# Patient Record
Sex: Male | Born: 1955 | Race: White | Hispanic: No | Marital: Single | Smoking: Current every day smoker
Health system: Southern US, Community
[De-identification: ages and names within clinical notes are randomized; demographics above are authoritative.]

---

## 2010-01-30 ENCOUNTER — Ambulatory Visit: Payer: Self-pay | Admitting: Gastroenterology

## 2010-03-14 ENCOUNTER — Ambulatory Visit (HOSPITAL_COMMUNITY): Admission: RE | Admit: 2010-03-14 | Discharge: 2010-03-14 | Payer: Self-pay | Admitting: Gastroenterology

## 2010-07-22 LAB — CBC
HCT: 45.3 % (ref 39.0–52.0)
Hemoglobin: 16 g/dL (ref 13.0–17.0)
MCH: 31.9 pg (ref 26.0–34.0)
MCHC: 35.3 g/dL (ref 30.0–36.0)
MCV: 90.4 fL (ref 78.0–100.0)
RDW: 12.7 % (ref 11.5–15.5)

## 2010-07-22 LAB — PROTIME-INR: INR: 0.94 (ref 0.00–1.49)

## 2011-11-19 IMAGING — US US BIOPSY
1 series · 9 of 9 positions shown · non-contrast
Comparison: none

Clinical: Hepatitis C.

ULTRASOUND-GUIDED RANDOM CORE LIVER BIOPSY:
An ultrasound guided liver biopsy was thoroughly discussed with the
patient and questions were answered. The benefits, risks,
alternatives, and complications were also discussed. The patient
understands and wishes to proceed with the procedure. A verbal as
well as written consent was obtained.
Ultrasound imaging of the liver was performed and an appropriate
skin entry site was determined. Skin site was marked, prepped and
draped in the usual sterile fashion. 1% Lidocaine was infiltrated
locally. A 17 gauge Trocar needle was advanced under ultrasound
guidance into the liver. Imaging was obtained documenting
appropriate needle position.  Three coaxial 18 gauge core samples
were then obtained and sent to the laboratory for further analysis.
Post procedure scans demonstrate no evidence of bleeding or
hematoma.  The patient tolerated the procedure well with no
immediate complication.
Cardiac and respiratory monitoring was provided by the radiology
RN.  Moderate sedation in the form of 50 mcg fentanyl and 3 mg of
versed were administered throughout the procedure for a total of 15
minutes.

[Series 1: us biopsy · 0.21mm/px · 9 of 9 slices shown]
[im 1/9]
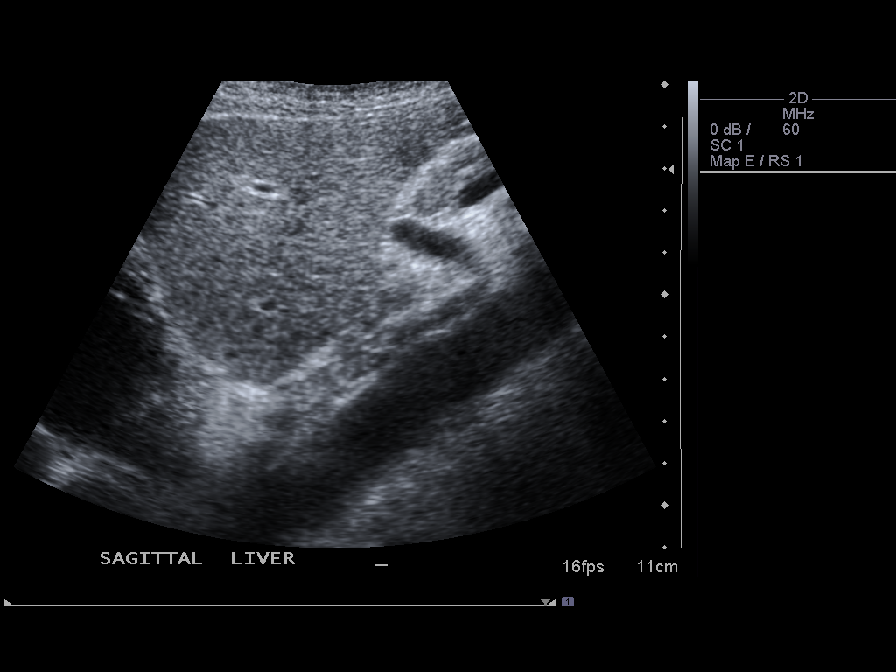
[im 2/9]
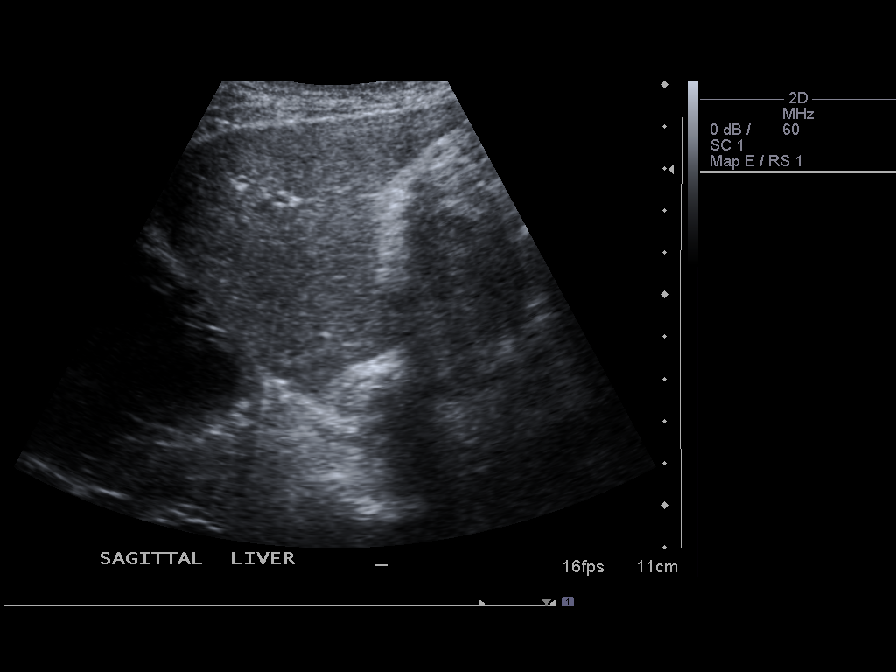
[im 3/9]
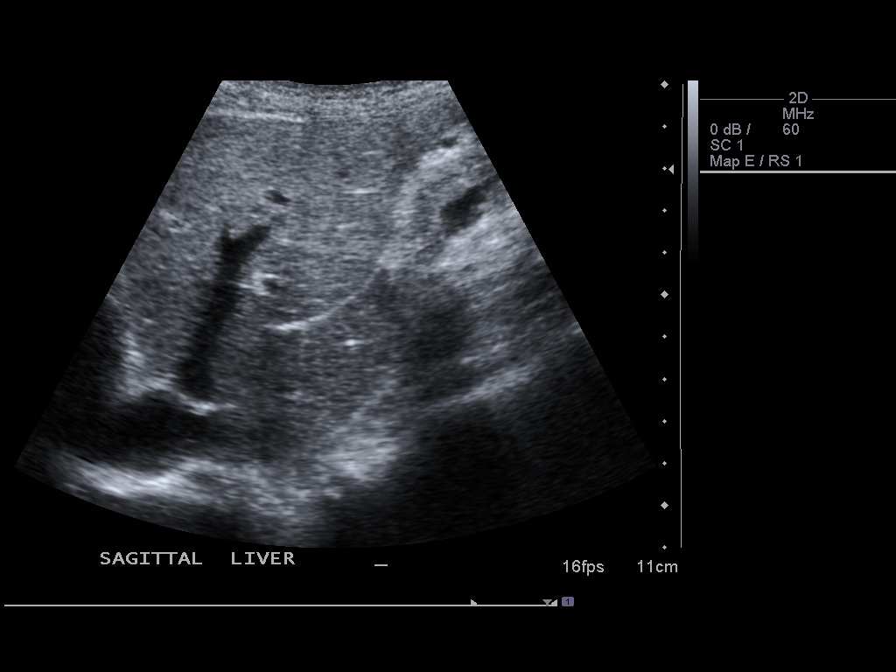
[im 4/9]
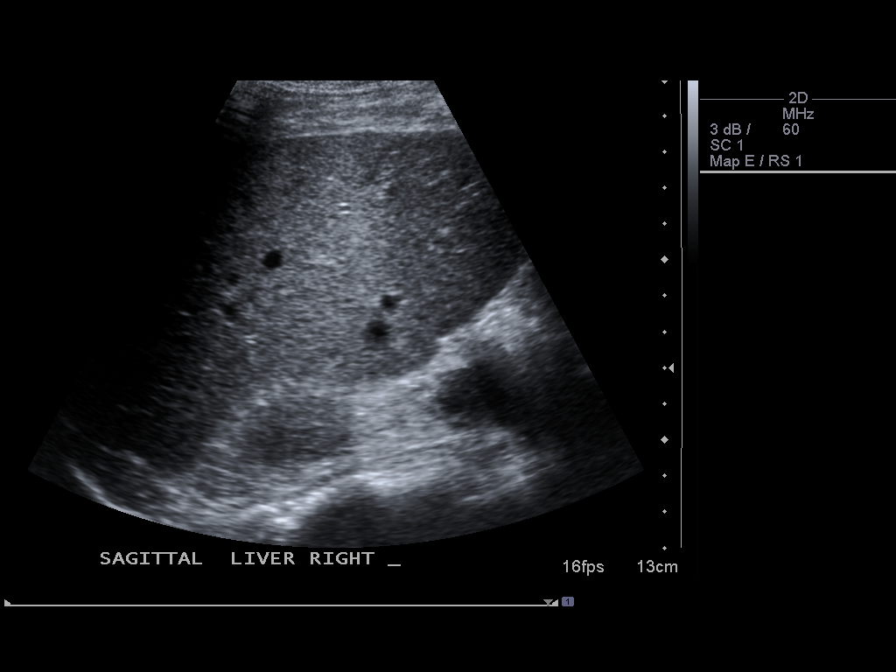
[im 5/9]
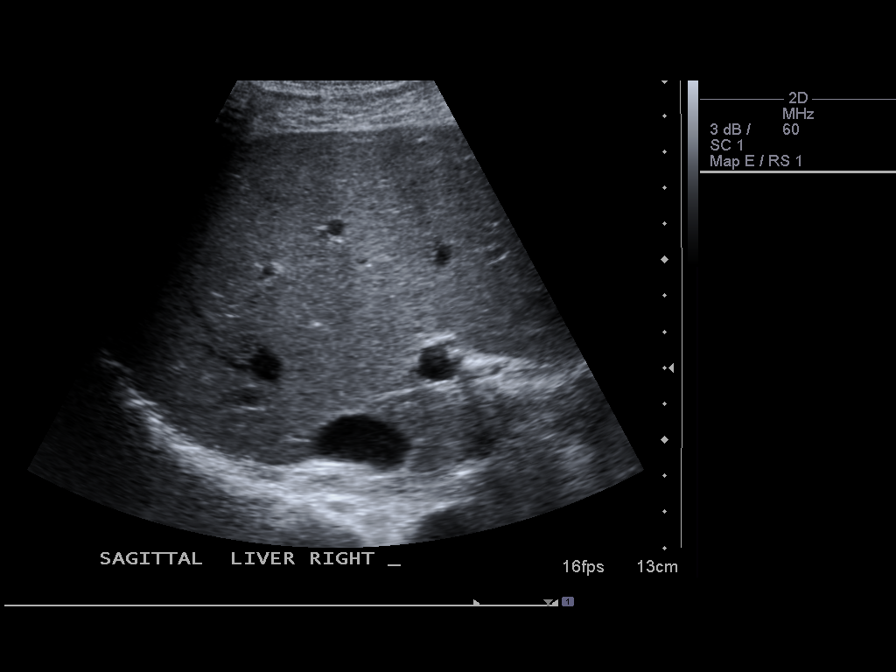
[im 6/9]
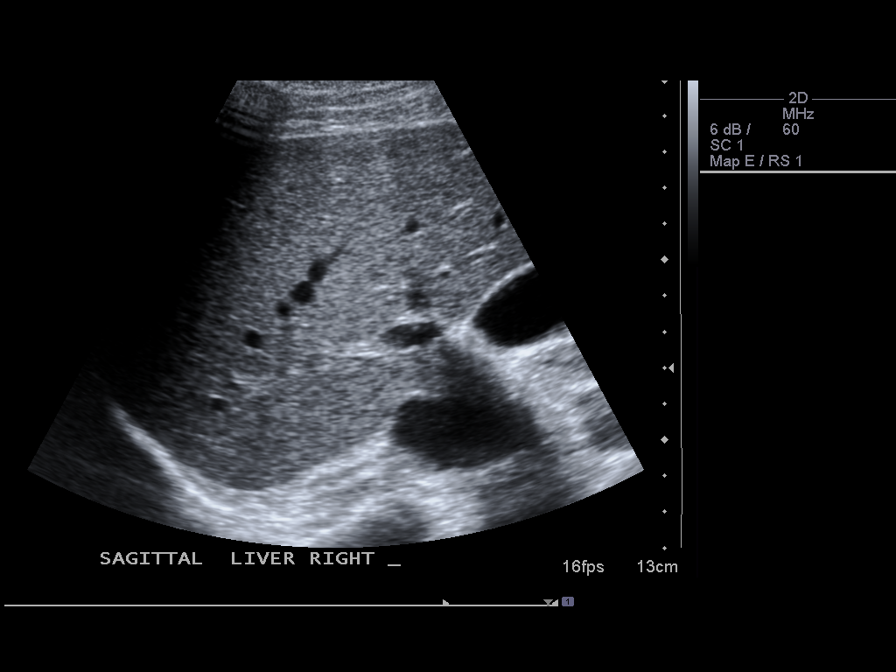
[im 7/9]
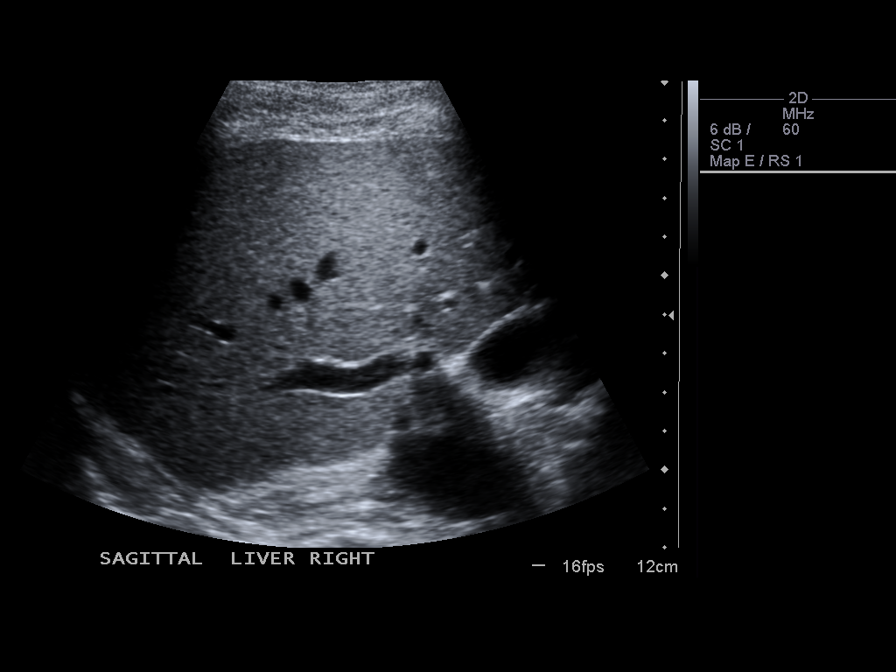
[im 8/9]
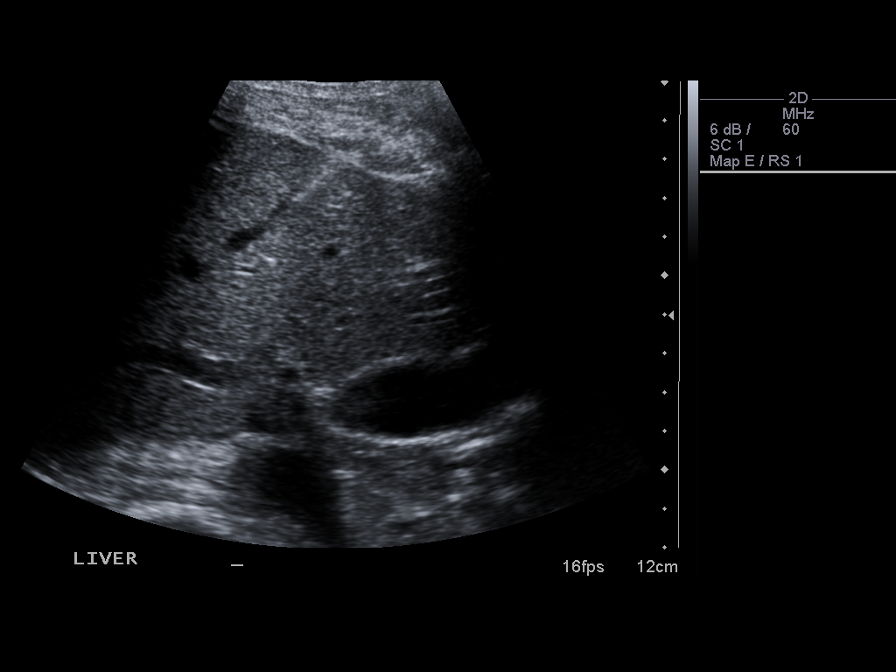
[im 9/9]
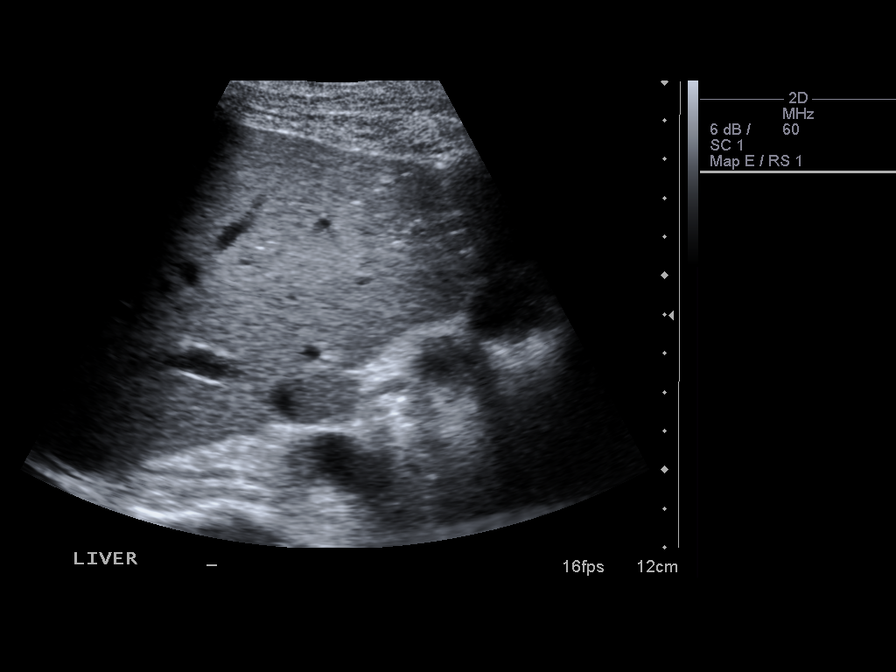

[9 of 9 positions shown; findings below may reference images not displayed]

IMPRESSION: 1. Technically successful ultrasound guided random liver core
biopsy with moderate sedation as described above.

Read by: Oxendine, Sorin.-ZEINAB

## 2015-08-29 ENCOUNTER — Emergency Department (HOSPITAL_BASED_OUTPATIENT_CLINIC_OR_DEPARTMENT_OTHER)
Admission: EM | Admit: 2015-08-29 | Discharge: 2015-08-29 | Disposition: A | Discharge status: Home / Self Care | Payer: Self-pay | Attending: Emergency Medicine | Admitting: Emergency Medicine

## 2015-08-29 ENCOUNTER — Encounter (HOSPITAL_BASED_OUTPATIENT_CLINIC_OR_DEPARTMENT_OTHER): Payer: Self-pay | Admitting: *Deleted

## 2015-08-29 ENCOUNTER — Emergency Department (HOSPITAL_BASED_OUTPATIENT_CLINIC_OR_DEPARTMENT_OTHER): Payer: Self-pay

## 2015-08-29 DIAGNOSIS — Y9261 Building [any] under construction as the place of occurrence of the external cause: Secondary | ICD-10-CM | POA: Insufficient documentation

## 2015-08-29 DIAGNOSIS — Y288XXA Contact with other sharp object, undetermined intent, initial encounter: Secondary | ICD-10-CM | POA: Insufficient documentation

## 2015-08-29 DIAGNOSIS — S61432A Puncture wound without foreign body of left hand, initial encounter: Secondary | ICD-10-CM | POA: Insufficient documentation

## 2015-08-29 DIAGNOSIS — F17210 Nicotine dependence, cigarettes, uncomplicated: Secondary | ICD-10-CM | POA: Insufficient documentation

## 2015-08-29 DIAGNOSIS — S61412A Laceration without foreign body of left hand, initial encounter: Secondary | ICD-10-CM | POA: Insufficient documentation

## 2015-08-29 DIAGNOSIS — Y990 Civilian activity done for income or pay: Secondary | ICD-10-CM | POA: Insufficient documentation

## 2015-08-29 DIAGNOSIS — Y93H3 Activity, building and construction: Secondary | ICD-10-CM | POA: Insufficient documentation

## 2015-08-29 DIAGNOSIS — S6992XA Unspecified injury of left wrist, hand and finger(s), initial encounter: Secondary | ICD-10-CM

## 2015-08-29 MED ORDER — HYDROCODONE-ACETAMINOPHEN 5-325 MG PO TABS: 2.0000 | ORAL_TABLET | ORAL | Status: AC | PRN

## 2015-08-29 MED ORDER — FENTANYL CITRATE (PF) 100 MCG/2ML IJ SOLN
50.0000 ug | Freq: Once | INTRAMUSCULAR | Status: AC
  Administered 2015-08-29: 50 ug via INTRAVENOUS
  Filled 2015-08-29: qty 2

## 2015-08-29 MED ORDER — TETANUS-DIPHTH-ACELL PERTUSSIS 5-2.5-18.5 LF-MCG/0.5 IM SUSP
0.5000 mL | Freq: Once | INTRAMUSCULAR | Status: AC
  Administered 2015-08-29: 0.5 mL via INTRAMUSCULAR
  Filled 2015-08-29: qty 0.5

## 2015-08-29 MED ORDER — ONDANSETRON HCL 4 MG/2ML IJ SOLN
4.0000 mg | Freq: Once | INTRAMUSCULAR | Status: AC
  Administered 2015-08-29: 4 mg via INTRAVENOUS
  Filled 2015-08-29: qty 2

## 2015-08-29 MED ORDER — LIDOCAINE HCL (PF) 1 % IJ SOLN
30.0000 mL | Freq: Once | INTRAMUSCULAR | Status: AC
  Administered 2015-08-29: 30 mL
  Filled 2015-08-29: qty 30

## 2015-08-29 MED ORDER — CEPHALEXIN 500 MG PO CAPS: 500.0000 mg | ORAL_CAPSULE | Freq: Three times a day (TID) | ORAL | Status: AC

## 2015-08-29 NOTE — ED Notes (Signed)
Laceration to his left hand with a wood working tool. He is pale and diaphoretic.

## 2015-08-29 NOTE — ED Notes (Signed)
Suture cart at bedside 

## 2015-08-29 NOTE — ED Notes (Signed)
Pa at the bedside suturing

## 2015-08-29 NOTE — Discharge Instructions (Signed)
Laceration Care, Adult °A laceration is a cut that goes through all of the layers of the skin and into the tissue that is right under the skin. Some lacerations heal on their own. Others need to be closed with stitches (sutures), staples, skin adhesive strips, or skin glue. Proper laceration care minimizes the risk of infection and helps the laceration to heal better. °HOW TO CARE FOR YOUR LACERATION °If sutures or staples were used: °· Keep the wound clean and dry. °· If you were given a bandage (dressing), you should change it at least one time per day or as told by your health care provider. You should also change it if it becomes wet or dirty. °· Keep the wound completely dry for the first 24 hours or as told by your health care provider. After that time, you may shower or bathe. However, make sure that the wound is not soaked in water until after the sutures or staples have been removed. °· Clean the wound one time each day or as told by your health care provider: °¨ Wash the wound with soap and water. °¨ Rinse the wound with water to remove all soap. °¨ Pat the wound dry with a clean towel. Do not rub the wound. °· After cleaning the wound, apply a thin layer of antibiotic ointment as told by your health care provider. This will help to prevent infection and keep the dressing from sticking to the wound. °· Have the sutures or staples removed as told by your health care provider. °If skin adhesive strips were used: °· Keep the wound clean and dry. °· If you were given a bandage (dressing), you should change it at least one time per day or as told by your health care provider. You should also change it if it becomes dirty or wet. °· Do not get the skin adhesive strips wet. You may shower or bathe, but be careful to keep the wound dry. °· If the wound gets wet, pat it dry with a clean towel. Do not rub the wound. °· Skin adhesive strips fall off on their own. You may trim the strips as the wound heals. Do not  remove skin adhesive strips that are still stuck to the wound. They will fall off in time. °If skin glue was used: °· Try to keep the wound dry, but you may briefly wet it in the shower or bath. Do not soak the wound in water, such as by swimming. °· After you have showered or bathed, gently pat the wound dry with a clean towel. Do not rub the wound. °· Do not do any activities that will make you sweat heavily until the skin glue has fallen off on its own. °· Do not apply liquid, cream, or ointment medicine to the wound while the skin glue is in place. Using those may loosen the film before the wound has healed. °· If you were given a bandage (dressing), you should change it at least one time per day or as told by your health care provider. You should also change it if it becomes dirty or wet. °· If a dressing is placed over the wound, be careful not to apply tape directly over the skin glue. Doing that may cause the glue to be pulled off before the wound has healed. °· Do not pick at the glue. The skin glue usually remains in place for 5-10 days, then it falls off of the skin. °General Instructions °· Take over-the-counter and prescription   medicines only as told by your health care provider. °· If you were prescribed an antibiotic medicine or ointment, take or apply it as told by your doctor. Do not stop using it even if your condition improves. °· To help prevent scarring, make sure to cover your wound with sunscreen whenever you are outside after stitches are removed, after adhesive strips are removed, or when glue remains in place and the wound is healed. Make sure to wear a sunscreen of at least 30 SPF. °· Do not scratch or pick at the wound. °· Keep all follow-up visits as told by your health care provider. This is important. °· Check your wound every day for signs of infection. Watch for: °· Redness, swelling, or pain. °· Fluid, blood, or pus. °· Raise (elevate) the injured area above the level of your heart  while you are sitting or lying down, if possible. °SEEK MEDICAL CARE IF: °· You received a tetanus shot and you have swelling, severe pain, redness, or bleeding at the injection site. °· You have a fever. °· A wound that was closed breaks open. °· You notice a bad smell coming from your wound or your dressing. °· You notice something coming out of the wound, such as wood or glass. °· Your pain is not controlled with medicine. °· You have increased redness, swelling, or pain at the site of your wound. °· You have fluid, blood, or pus coming from your wound. °· You notice a change in the color of your skin near your wound. °· You need to change the dressing frequently due to fluid, blood, or pus draining from the wound. °· You develop a new rash. °· You develop numbness around the wound. °SEEK IMMEDIATE MEDICAL CARE IF: °· You develop severe swelling around the wound. °· Your pain suddenly increases and is severe. °· You develop painful lumps near the wound or on skin that is anywhere on your body. °· You have a red streak going away from your wound. °· The wound is on your hand or foot and you cannot properly move a finger or toe. °· The wound is on your hand or foot and you notice that your fingers or toes look pale or bluish. °  °This information is not intended to replace advice given to you by your health care provider. Make sure you discuss any questions you have with your health care provider. °  °Document Released: 04/27/2005 Document Revised: 09/11/2014 Document Reviewed: 04/23/2014 °Elsevier Interactive Patient Education ©2016 [employer] ° °Sutured Wound Care °Sutures are stitches that can be used to close wounds. Taking care of your wound properly can help to prevent pain and infection. It can also help your wound to heal more quickly. °HOW TO CARE FOR YOUR SUTURED WOUND °Wound Care °· Keep the wound clean and dry. °· If you were given a bandage (dressing), you should change it at least once per day or  as directed by your health care provider. You should also change it if it becomes wet or dirty. °· Keep the wound completely dry for the first 24 hours or as directed by your health care provider. After that time, you may shower or bathe. However, make sure that the wound is not soaked in water until the sutures have been removed. °· Clean the wound one time each day or as directed by your health care provider. °¨ Wash the wound with soap and water. °¨ Rinse the wound with water to remove all soap. °¨   Pat the wound dry with a clean towel. Do not rub the wound. °· After cleaning the wound, apply a thin layer of antibiotic ointment as directed by your health care provider. This will help to prevent infection and keep the dressing from sticking to the wound. °· Have the sutures removed as directed by your health care provider. °General Instructions °· Take or apply medicines only as directed by your health care provider. °· To help prevent scarring, make sure to cover your wound with sunscreen whenever you are outside after the sutures are removed and the wound is healed. Make sure to wear a sunscreen of at least 30 SPF. °· If you were prescribed an antibiotic medicine or ointment, finish all of it even if you start to feel better. °· Do not scratch or pick at the wound. °· Keep all follow-up visits as directed by your health care provider. This is important. °· Check your wound every day for signs of infection. Watch for:   °¨ Redness, swelling, or pain. °¨ Fluid, blood, or pus. °· Raise (elevate) the injured area above the level of your heart while you are sitting or lying down, if possible. °· Avoid stretching your wound. °· Drink enough fluids to keep your urine clear or pale yellow. °SEEK MEDICAL CARE IF: °· You received a tetanus shot and you have swelling, severe pain, redness, or bleeding at the injection site. °· You have a fever. °· A wound that was closed breaks open. °· You notice a bad smell coming from  the wound. °· You notice something coming out of the wound, such as wood or glass. °· Your pain is not controlled with medicine. °· You have increased redness, swelling, or pain at the site of your wound. °· You have fluid, blood, or pus coming from your wound. °· You notice a change in the color of your skin near your wound. °· You need to change the dressing frequently due to fluid, blood, or pus draining from the wound. °· You develop a new rash. °· You develop numbness around the wound. °SEEK IMMEDIATE MEDICAL CARE IF: °· You develop severe swelling around the injury site. °· Your pain suddenly increases and is severe. °· You develop painful lumps near the wound or on skin that is anywhere on your body. °· You have a red streak going away from your wound. °· The wound is on your hand or foot and you cannot properly move a finger or toe. °· The wound is on your hand or foot and you notice that your fingers or toes look pale or bluish. °  °This information is not intended to replace advice given to you by your health care provider. Make sure you discuss any questions you have with your health care provider. °  °Document Released: 06/04/2004 Document Revised: 09/11/2014 Document Reviewed: 12/07/2012 °Elsevier Interactive Patient Education ©2016 [employer] ° °

## 2015-08-29 NOTE — ED Provider Notes (Signed)
CSN: [id number]     Arrival date & time 08/29/15  1620 History   First MD Initiated Contact with Patient 08/29/15 1633     Chief Complaint  Patient presents with  . Extremity Laceration     (Consider location/radiation/quality/duration/timing/severity/associated sxs/prior Treatment) HPI  Is a 60 year old male who presents emergency Department with chief complaint of left hand injury. 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 Patient states that it came un-plugged in one of his coworkers turned it back on without him realizing it and it cut into his left thenar eminence. He c/o pain in that area. Bleeding is controlled. He denies numbness or tingling. He is able to move his thumb in all directions. Unsure of last tetanus.   History reviewed. No pertinent past medical history. History reviewed. No pertinent past surgical history. No family history on file. Social History  Substance Use Topics  . Smoking status: Current Every Day Smoker -- 0.50 packs/day    Types: Cigarettes  . Smokeless tobacco: None  . Alcohol Use: No    Review of Systems  Ten systems reviewed and are negative for acute change, except as noted in the HPI.    Allergies  Review of patient's allergies indicates no known allergies.  Home Medications   Prior to Admission medications   Not on File   BP 93/56 mmHg  Pulse 79  Temp(Src) 97.5 F (36.4 C) (Oral)  Resp 22  Ht 5' 4.5" (1.638 m)  Wt 65.772 kg  BMI 24.51 kg/m2  SpO2 100% Physical Exam  Constitutional: He is oriented to person, place, and time. He appears well-developed and well-nourished. No distress.  HENT:  Head: Normocephalic and atraumatic.  Eyes: Conjunctivae are normal. No scleral icterus.  Neck: Normal range of motion. Neck supple.  Cardiovascular: Normal rate, regular rhythm and normal heart sounds.   Pulmonary/Chest: Effort normal and breath sounds normal. No respiratory distress.  Abdominal:  Soft. There is no tenderness.  Musculoskeletal: He exhibits no edema.  Left hand with large puncture wound (about 4 cm) into left thenar eminence. Mangled tissue hangs at the superficial aspect 6 cm laceration extending across the palm away from the puncture sites. Full ROM including abduction, adduction, flexion, extension and opposition. Full strength and sensation. Cap refill < 3 sec.   Neurological: He is alert and oriented to person, place, and time.  Skin: Skin is warm and dry. He is not diaphoretic.  Psychiatric: His behavior is normal.  Nursing note and vitals reviewed.   ED Course  .Marland Kitchen.Laceration Repair Date/Time: 08/29/2015 7:36 PM Performed by: Arthor CaptainHARRIS, Pleshette Tomasini Authorized by: Arthor CaptainHARRIS, Lou Loewe Consent: Verbal consent obtained. Risks and benefits: risks, benefits and alternatives were discussed Consent given by: patient Patient identity confirmed: verbally with patient and provided demographic data Time out: Immediately prior to procedure a "time out" was called to verify the correct patient, procedure, equipment, support staff and site/side marked as required. Body area: upper extremity Location details: left hand Laceration length: 10 cm Foreign bodies: no foreign bodies Tendon involvement: none Nerve involvement: none Vascular damage: no Anesthesia: local infiltration Local anesthetic: lidocaine 1% without epinephrine Anesthetic total: 15 ml Patient sedated: no Irrigation solution: saline Irrigation method: jet lavage Amount of cleaning: extensive Debridement: minimal Degree of undermining: none Skin closure: 4-0 Prolene Subcutaneous closure: 4-0 Vicryl Number of sutures: 16 Technique: simple Approximation: close Approximation difficulty: complex Comments: Deep laceration involving the underlying muscle of the thenar eminence.    (including critical care  time) Labs Review Labs Reviewed - No data to display  Imaging Review No results found. I have personally  reviewed and evaluated these images and lab results as part of my medical decision-making.   EKG Interpretation   Date/Time:  Thursday August 29 2015 16:34:09 EDT Ventricular Rate:  85 PR Interval:  152 QRS Duration: 90 QT Interval:  360 QTC Calculation: 428 R Axis:   82 Text Interpretation:  Sinus rhythm Borderline right axis deviation Minimal  ST elevation, inferior leads no previous Confirmed by ZACKOWSKI  MD, SCOTT  ([id number]) on 08/29/2015 4:44:58 PM      MDM   Final diagnoses:  Puncture wound to hand, left, initial encounter  Injury of palm, left, initial encounter  Laceration of hand, left, initial encounter    Patient with complicated hand injury. Repaired here with sub q stitches to bring muscular layers together. Top layers closed with prolene. Discussed home care and when to return for removal of sutures. Discussed return precautions.   Arthor Captain, PA-C 08/31/15 2223  Vanetta Mulders, MD 09/01/15 1191  Vanetta Mulders, MD 09/01/15 [phone]

## 2015-08-29 NOTE — ED Notes (Signed)
Patient transported to and from radiology department via stretcher. 

## 2017-05-05 IMAGING — DX DG HAND COMPLETE 3+V*L*
3 series · 3 of 3 positions shown · non-contrast
Comparison: None.

CLINICAL DATA: A router gouged the palm of the patient's left hand
today. Initial encounter.

EXAM:
LEFT HAND - COMPLETE 3+ VIEW

[hand pa]
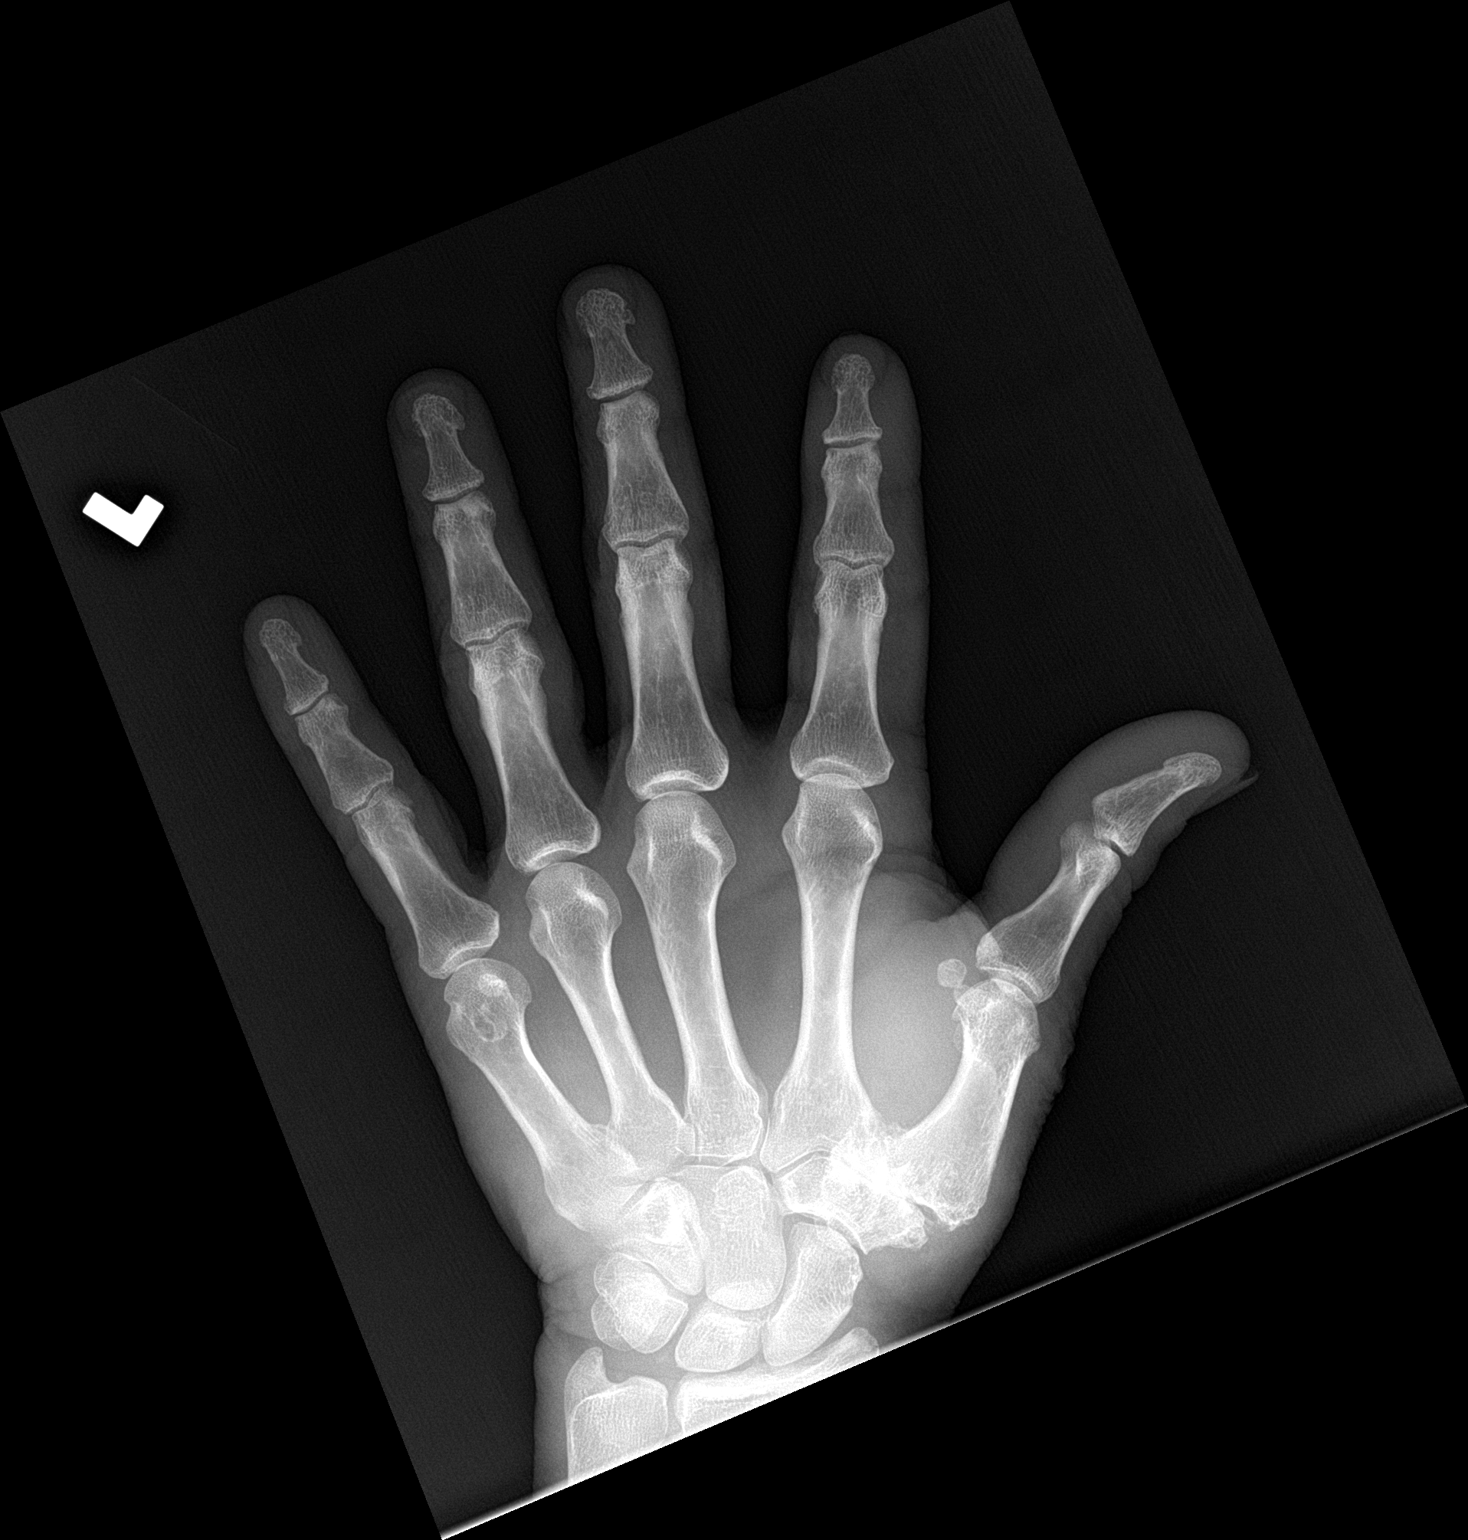

[hand obl]
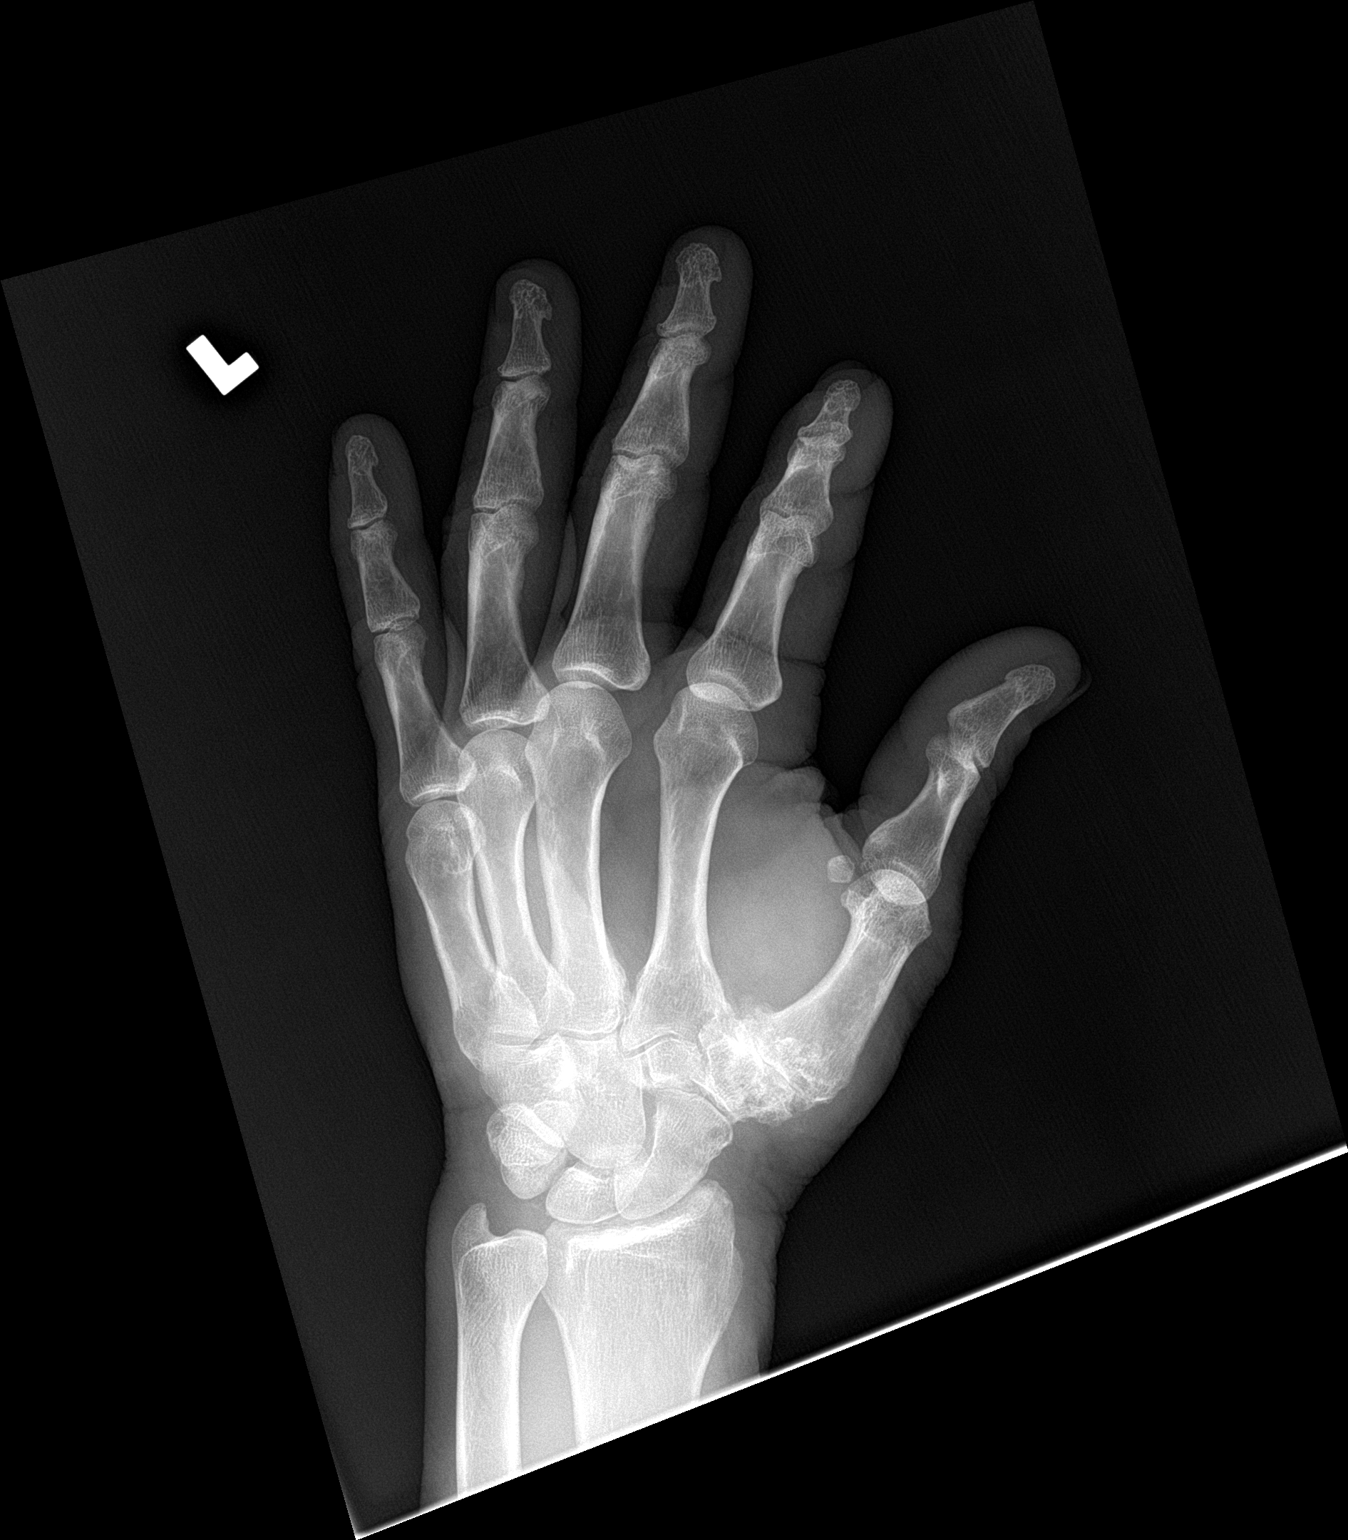

[hand lat]
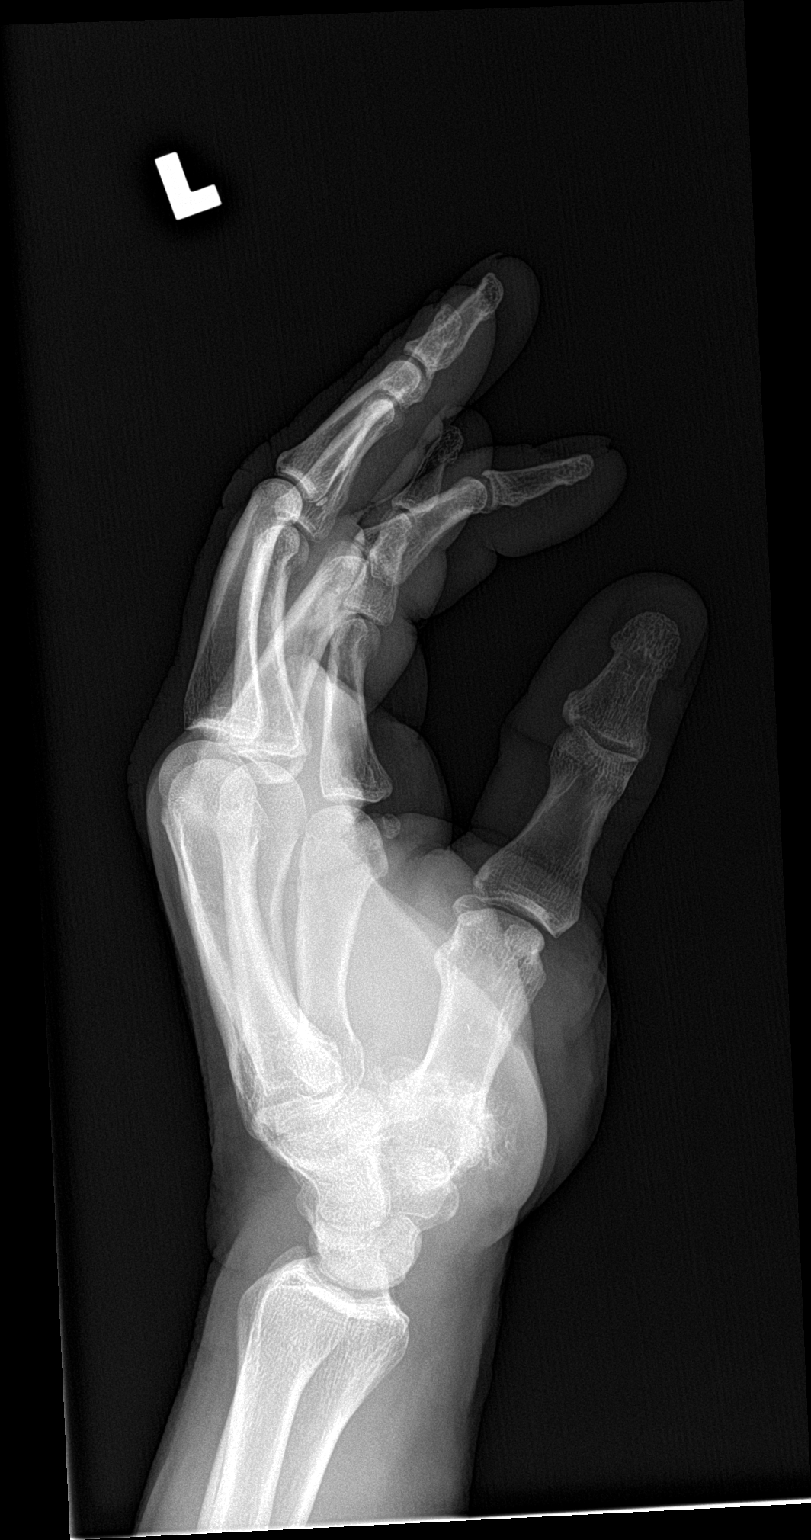

[3 of 3 positions shown; findings below may reference images not displayed]

FINDINGS: No fracture, dislocation or radiopaque foreign body is identified.
The patient has severe first CMC osteoarthritis. A well-marginated
small lesion in the head of the fifth metacarpal and is likely a
cyst.
IMPRESSION: Negative for fracture foreign body.

Severe first CMC osteoarthritis.
